# Patient Record
Sex: Female | Born: 1985 | Race: White | Hispanic: No | Marital: Single | Smoking: Current every day smoker
Health system: Southern US, Community
[De-identification: ages and names within clinical notes are randomized; demographics above are authoritative.]

## PROBLEM LIST (undated history)

## (undated) DIAGNOSIS — G43909 Migraine, unspecified, not intractable, without status migrainosus: Secondary | ICD-10-CM

---

## 2018-07-01 DIAGNOSIS — H53149 Visual discomfort, unspecified: Secondary | ICD-10-CM | POA: Insufficient documentation

## 2018-07-01 DIAGNOSIS — G43809 Other migraine, not intractable, without status migrainosus: Secondary | ICD-10-CM | POA: Insufficient documentation

## 2018-07-02 ENCOUNTER — Emergency Department
Admission: EM | Admit: 2018-07-02 | Discharge: 2018-07-02 | Disposition: A | Discharge status: Home / Self Care | Payer: Self-pay | Attending: Emergency Medicine | Admitting: Emergency Medicine

## 2018-07-02 ENCOUNTER — Other Ambulatory Visit: Payer: Self-pay

## 2018-07-02 ENCOUNTER — Encounter: Payer: Self-pay | Admitting: Emergency Medicine

## 2018-07-02 DIAGNOSIS — G43809 Other migraine, not intractable, without status migrainosus: Secondary | ICD-10-CM

## 2018-07-02 HISTORY — DX: Migraine, unspecified, not intractable, without status migrainosus: G43.909

## 2018-07-02 MED ORDER — KETOROLAC TROMETHAMINE 30 MG/ML IJ SOLN
30.0000 mg | Freq: Once | INTRAMUSCULAR | Status: AC
  Administered 2018-07-02: 30 mg via INTRAVENOUS
  Filled 2018-07-02: qty 1

## 2018-07-02 MED ORDER — METOCLOPRAMIDE HCL 5 MG/ML IJ SOLN
10.0000 mg | Freq: Once | INTRAMUSCULAR | Status: AC
  Administered 2018-07-02: 10 mg via INTRAVENOUS
  Filled 2018-07-02: qty 2

## 2018-07-02 MED ORDER — BUTALBITAL-APAP-CAFFEINE 50-325-40 MG PO TABS: 1.0000 | ORAL_TABLET | Freq: Three times a day (TID) | ORAL | 0 refills | Status: DC | PRN

## 2018-07-02 NOTE — ED Notes (Signed)
Pt sleeping quietly. MD aware.

## 2018-07-02 NOTE — ED Triage Notes (Signed)
Patient ambulatory to triage with steady gait, without difficulty or distress noted; pt reports migraines to temples several days; st hx of same

## 2018-07-02 NOTE — ED Notes (Signed)
Reviewed discharge paperwork with patient. Patient states pain is a 4/10 and is easing off. Patient states understanding of discharge paperwork and prescription. Patient signed discharge.

## 2018-07-02 NOTE — ED Provider Notes (Signed)
[facility] Emergency Department Provider Note ________________________   First MD Initiated Contact with Patient 07/02/18 [phone]     (approximate)  I have reviewed the triage vital signs and the nursing notes.   HISTORY  Chief Complaint Headache    HPI Allison Larson is a 33 y.o. female with history of migraine headaches presents to the emergency department with "migraine headache x4 days.  Patient denies taking any medications at home for headache.  Patient does admit to photophobia.  Patient denies any weakness numbness gait instability.  Patient also admits to need to change her prescription for her glasses  Past Medical History:  Diagnosis Date  . Migraine     There are no active problems to display for this patient.   History reviewed. No pertinent surgical history.  Prior to Admission medications   Not on File    Allergies No known drug allergies No family history on file.  Social History Social History   Tobacco Use  . Smoking status: Not on file  Substance Use Topics  . Alcohol use: Not on file  . Drug use: Not on file    Review of Systems Constitutional: No fever/chills Eyes: No visual changes. ENT: No sore throat. Cardiovascular: Denies chest pain. Respiratory: Denies shortness of breath. Gastrointestinal: No abdominal pain.  No nausea, no vomiting.  No diarrhea.  No constipation. Genitourinary: Negative for dysuria. Musculoskeletal: Negative for neck pain.  Negative for back pain. Integumentary: Negative for rash. Neurological: Positive for headaches, positive for focal weakness or numbness.   ____________________________________________   PHYSICAL EXAM:  VITAL SIGNS: ED Triage Vitals  Enc Vitals Group     BP 07/02/18 0011 124/75     Pulse Rate 07/02/18 0011 74     Resp 07/02/18 0011 18     Temp 07/02/18 0011 98.6 F (37 C)     Temp Source 07/02/18 0011 Oral     SpO2 07/02/18 0011 98 %     Weight 07/02/18  0010 88.5 kg (195 lb)     Height 07/02/18 0010 1.6 m (5\' 3" )     Head Circumference --      Peak Flow --      Pain Score 07/02/18 0010 9     Pain Loc --      Pain Edu? --      Excl. in GC? --     Constitutional: Alert and oriented. Well appearing and in no acute distress. Eyes: Conjunctivae are normal.  Mouth/Throat: Mucous membranes are moist. Oropharynx non-erythematous. Neck: No stridor.  Cardiovascular: Normal rate, regular rhythm. Good peripheral circulation. Grossly normal heart sounds. Respiratory: Normal respiratory effort.  No retractions. Lungs CTAB. Gastrointestinal: Soft and nontender. No distention.  Musculoskeletal: No lower extremity tenderness nor edema. No gross deformities of extremities. Neurologic:  Normal speech and language. No gross focal neurologic deficits are appreciated.  Skin:  Skin is warm, dry and intact. No rash noted. Psychiatric: Mood and affect are normal. Speech and behavior are normal.  ____________________________________________    Procedures   ____________________________________________   INITIAL IMPRESSION / ASSESSMENT AND PLAN / ED COURSE  As part of my medical decision making, I reviewed the following data within the electronic MEDICAL RECORD NUMBER   33 year old female present with above-stated history and physical exam consistent with migraine headache which patient states is consistent with previous migraines.  Patient given Toradol and Reglan in the emergency department with resolution of headache.  Patient will be prescribed Fioricet for home ____________________________________________  FINAL CLINICAL IMPRESSION(S) / ED DIAGNOSES  Final diagnoses:  Other migraine without status migrainosus, not intractable     MEDICATIONS GIVEN DURING THIS VISIT:  Medications  ketorolac (TORADOL) 30 MG/ML injection 30 mg (30 mg Intravenous Given 07/02/18 0531)  metoCLOPramide (REGLAN) injection 10 mg (10 mg Intravenous Given 07/02/18 0531)      ED Discharge Orders    None       Note:  This document was prepared using Dragon voice recognition software and may include unintentional dictation errors.    Darci Current, MD 07/02/18 [phone]

## 2018-07-02 NOTE — ED Notes (Signed)
EDP Dr. Manson PasseyBrown in with the patient

## 2018-07-12 ENCOUNTER — Other Ambulatory Visit: Payer: Self-pay

## 2018-07-12 ENCOUNTER — Emergency Department
Admission: EM | Admit: 2018-07-12 | Discharge: 2018-07-12 | Disposition: A | Discharge status: Home / Self Care | Payer: Self-pay | Attending: Emergency Medicine | Admitting: Emergency Medicine

## 2018-07-12 DIAGNOSIS — N390 Urinary tract infection, site not specified: Secondary | ICD-10-CM | POA: Insufficient documentation

## 2018-07-12 DIAGNOSIS — F17210 Nicotine dependence, cigarettes, uncomplicated: Secondary | ICD-10-CM | POA: Insufficient documentation

## 2018-07-12 DIAGNOSIS — N3001 Acute cystitis with hematuria: Secondary | ICD-10-CM

## 2018-07-12 LAB — URINALYSIS, COMPLETE (UACMP) WITH MICROSCOPIC
Bacteria, UA: NONE SEEN
Bilirubin Urine: NEGATIVE
Glucose, UA: NEGATIVE mg/dL
Ketones, ur: NEGATIVE mg/dL
Nitrite: NEGATIVE
Protein, ur: NEGATIVE mg/dL
Specific Gravity, Urine: 1.025 (ref 1.005–1.030)
pH: 6 (ref 5.0–8.0)

## 2018-07-12 LAB — POCT PREGNANCY, URINE: Preg Test, Ur: NEGATIVE

## 2018-07-12 MED ORDER — PHENAZOPYRIDINE HCL 200 MG PO TABS: 200.0000 mg | ORAL_TABLET | Freq: Three times a day (TID) | ORAL | 0 refills | Status: DC | PRN

## 2018-07-12 MED ORDER — CEPHALEXIN 500 MG PO CAPS: 500.0000 mg | ORAL_CAPSULE | Freq: Two times a day (BID) | ORAL | 0 refills | Status: AC

## 2018-07-12 NOTE — ED Provider Notes (Signed)
[facility] Emergency Department Provider Note  ____________________________________________  Time seen: Approximately 2:58 PM  I have reviewed the triage vital signs and the nursing notes.   HISTORY  Chief Complaint Urinary Tract Infection    HPI Allison Larson is a 33 y.o. female who presents to the emergency department for evaluation and treatment of UTI symptoms. She states that she has frequent UTIs with very similar symptoms.  She complains of low back pain and urinary frequency.  Symptoms started last night.  No relief with over-the-counter Azo.  Past Medical History:  Diagnosis Date  . Migraine     There are no active problems to display for this patient.   History reviewed. No pertinent surgical history.  Prior to Admission medications   Medication Sig Start Date End Date Taking? Authorizing Provider  butalbital-acetaminophen-caffeine (FIORICET, ESGIC) 50-325-40 MG tablet Take 1 tablet by mouth every 8 (eight) hours as needed for up to 20 doses for headache. 07/02/18   Darci CurrentBrown, [place] N, MD  cephALEXin (KEFLEX) 500 MG capsule Take 1 capsule (500 mg total) by mouth 2 (two) times daily for 7 days. 07/12/18 07/19/18  Darrel Gloss, Kasandra Knudsenari B, FNP  phenazopyridine (PYRIDIUM) 200 MG tablet Take 1 tablet (200 mg total) by mouth 3 (three) times daily as needed for pain. 07/12/18   Chinita Pesterriplett, Liahm Grivas B, FNP    Allergies Patient has no known allergies.  History reviewed. No pertinent family history.  Social History Social History   Tobacco Use  . Smoking status: Current Every Day Smoker  Substance Use Topics  . Alcohol use: Not Currently  . Drug use: Not on file    Review of Systems Constitutional: Negative for fever. Respiratory: Negative for shortness of breath or cough. Gastrointestinal: Positive for abdominal pain; negative for nausea , negative for vomiting. Genitourinary: Negative for dysuria , negative for vaginal discharge.  Positive for urinary  frequency Musculoskeletal: Positive for back pain. Skin: Negative for acute skin changes/rash/lesion. ____________________________________________   PHYSICAL EXAM:  VITAL SIGNS: ED Triage Vitals  Enc Vitals Group     BP 07/12/18 1400 133/63     Pulse Rate 07/12/18 1400 73     Resp 07/12/18 1400 16     Temp 07/12/18 1400 98.1 F (36.7 C)     Temp Source 07/12/18 1400 Oral     SpO2 07/12/18 1400 97 %     Weight 07/12/18 1401 200 lb (90.7 kg)     Height 07/12/18 1401 5\' 3"  (1.6 m)     Head Circumference --      Peak Flow --      Pain Score 07/12/18 1401 8     Pain Loc --      Pain Edu? --      Excl. in GC? --     Constitutional: Alert and oriented. Well appearing and in no acute distress. Eyes: Conjunctivae are normal. Head: Atraumatic. Nose: No congestion/rhinnorhea. Mouth/Throat: Mucous membranes are moist. Respiratory: Normal respiratory effort.  No retractions. Gastrointestinal: Bowel sounds active x 4; Abdomen is soft without rebound or guarding. Genitourinary: Pelvic exam: Not indicated Musculoskeletal: No extremity tenderness nor edema.  Neurologic:  Normal speech and language. No gross focal neurologic deficits are appreciated. Speech is normal. No gait instability. Skin:  Skin is warm, dry and intact. No rash noted on exposed skin. Psychiatric: Mood and affect are normal. Speech and behavior are normal.  ____________________________________________   LABS (all labs ordered are listed, but only abnormal results are displayed)  Labs Reviewed  URINALYSIS, COMPLETE (UACMP) WITH MICROSCOPIC - Abnormal; Notable for the following components:      Result Value   Color, Urine YELLOW (*)    APPearance HAZY (*)    Hgb urine dipstick LARGE (*)    Leukocytes, UA TRACE (*)    All other components within normal limits  POCT PREGNANCY, URINE  POC URINE PREG, ED   ____________________________________________  RADIOLOGY  Not  indicated ____________________________________________  Procedures  ____________________________________________  33 year old female presenting to the emergency department with her typical urinary tract infection symptoms.  She states that her UTIs typically start out with frequency and low back pain and then rapidly progressed to dysuria.  She states that she has been hospitalized in the past for kidney infections.  She does not feel that she has a kidney infection today, but wanted to get the urinary tract infection diagnosed and treated as soon as possible.  She was last treated in [place].  She recently moved to the area.  He is unsure if any cultures have been performed previously but denies knowledge of any resistant types of bacteria in the past.  Today, she will receive a prescription for Keflex and Pyridium.  She was advised to establish a primary care provider soon as possible, otherwise she will return to the emergency department for symptoms of change or worsen.  INITIAL IMPRESSION / ASSESSMENT AND PLAN / ED COURSE  Pertinent labs & imaging results that were available during my care of the patient were reviewed by me and considered in my medical decision making (see chart for details).  ____________________________________________   FINAL CLINICAL IMPRESSION(S) / ED DIAGNOSES  Final diagnoses:  Acute cystitis with hematuria    Note:  This document was prepared using Dragon voice recognition software and may include unintentional dictation errors.    Chinita Pester, FNP 07/12/18 1516    Dionne Bucy, MD 07/12/18 2238

## 2018-07-12 NOTE — ED Triage Notes (Signed)
States she thinks she has UTI. States low back pain and urinary frequency. States she gets frequent UTI's. Noticed last night. Denies foul odor or burning. A&O, ambulatory. No distress noted.

## 2018-07-12 NOTE — Discharge Instructions (Signed)
Please return to the emergency department if not improving over the next 2 to 3 days or sooner if your symptoms worsen.  You may also take Tylenol or ibuprofen if needed for pain.

## 2018-08-14 ENCOUNTER — Emergency Department
Admission: EM | Admit: 2018-08-14 | Discharge: 2018-08-14 | Disposition: A | Discharge status: Home / Self Care | Payer: Self-pay | Attending: Emergency Medicine | Admitting: Emergency Medicine

## 2018-08-14 ENCOUNTER — Other Ambulatory Visit: Payer: Self-pay

## 2018-08-14 DIAGNOSIS — F17200 Nicotine dependence, unspecified, uncomplicated: Secondary | ICD-10-CM | POA: Insufficient documentation

## 2018-08-14 DIAGNOSIS — N390 Urinary tract infection, site not specified: Secondary | ICD-10-CM | POA: Insufficient documentation

## 2018-08-14 LAB — URINALYSIS, COMPLETE (UACMP) WITH MICROSCOPIC
Bilirubin Urine: NEGATIVE
Glucose, UA: NEGATIVE mg/dL
Hgb urine dipstick: NEGATIVE
Ketones, ur: NEGATIVE mg/dL
Nitrite: NEGATIVE
Protein, ur: NEGATIVE mg/dL
Specific Gravity, Urine: 1.019 (ref 1.005–1.030)
pH: 6 (ref 5.0–8.0)

## 2018-08-14 LAB — POCT PREGNANCY, URINE: Preg Test, Ur: NEGATIVE

## 2018-08-14 MED ORDER — NITROFURANTOIN MONOHYD MACRO 100 MG PO CAPS: 100.0000 mg | ORAL_CAPSULE | Freq: Two times a day (BID) | ORAL | 0 refills | Status: AC

## 2018-08-14 NOTE — ED Provider Notes (Signed)
[facility] Emergency Department Provider Note   ____________________________________________    I have reviewed the triage vital signs and the nursing notes.   HISTORY  Chief Complaint Back Pain and Hematuria     HPI Allison Larson is a 33 y.o. female who presents with complaints of bilateral low back pain as well as urinary frequency.  She reports that she was treated for UTI 2 weeks ago and seemed to briefly improve but then has come back again.  She denies fevers or chills.  No nausea or vomiting.  She reports she completed her course of Keflex.  Past Medical History:  Diagnosis Date  . Migraine     There are no active problems to display for this patient.   No past surgical history on file.  Prior to Admission medications   Medication Sig Start Date End Date Taking? Authorizing Provider  butalbital-acetaminophen-caffeine (FIORICET, ESGIC) 50-325-40 MG tablet Take 1 tablet by mouth every 8 (eight) hours as needed for up to 20 doses for headache. 07/02/18   Darci Current, MD  nitrofurantoin, macrocrystal-monohydrate, (MACROBID) 100 MG capsule Take 1 capsule (100 mg total) by mouth 2 (two) times daily for 7 days. 08/14/18 08/21/18  Jene Every, MD  phenazopyridine (PYRIDIUM) 200 MG tablet Take 1 tablet (200 mg total) by mouth 3 (three) times daily as needed for pain. 07/12/18   Chinita Pester, FNP     Allergies Shellfish allergy  No family history on file.  Social History Social History   Tobacco Use  . Smoking status: Current Every Day Smoker  Substance Use Topics  . Alcohol use: Not Currently  . Drug use: Not on file    Review of Systems  Constitutional: No fever/chills Eyes: No visual changes.  ENT: No sore throat. Cardiovascular: Denies chest pain. Respiratory: Denies shortness of breath. Gastrointestinal: No abdominal pain.  No nausea, no vomiting.  No flank pain Genitourinary: No dysuria, positive  frequency Musculoskeletal: Low back pain as above Skin: Negative for rash. Neurological: Negative for headaches    ____________________________________________   PHYSICAL EXAM:  VITAL SIGNS: ED Triage Vitals  Enc Vitals Group     BP 08/14/18 1904 117/70     Pulse Rate 08/14/18 1904 86     Resp 08/14/18 1904 16     Temp 08/14/18 1904 98.4 F (36.9 C)     Temp Source 08/14/18 1904 Oral     SpO2 08/14/18 1904 98 %     Weight 08/14/18 1905 88.5 kg (195 lb)     Height 08/14/18 1905 1.6 m (5\' 3" )     Head Circumference --      Peak Flow --      Pain Score 08/14/18 1905 10     Pain Loc --      Pain Edu? --      Excl. in GC? --     Constitutional: Alert and oriented.  Eyes: Conjunctivae are normal.   Nose: No congestion/rhinnorhea. Mouth/Throat: Mucous membranes are moist.    Cardiovascular: Normal rate, regular rhythm. Grossly normal heart sounds.  Good peripheral circulation. Respiratory: Normal respiratory effort.  No retractions. Lungs CTAB. Gastrointestinal: Soft and nontender. No distention.  No CVA tenderness.  Reassuring exam Genitourinary: deferred Musculoskeletal: .  Warm and well perfused Neurologic:  Normal speech and language. No gross focal neurologic deficits are appreciated.  Skin:  Skin is warm, dry and intact. No rash noted. Psychiatric: Mood and affect are normal. Speech and behavior are normal.  ____________________________________________   LABS (all labs ordered are listed, but only abnormal results are displayed)  Labs Reviewed  URINALYSIS, COMPLETE (UACMP) WITH MICROSCOPIC - Abnormal; Notable for the following components:      Result Value   Color, Urine YELLOW (*)    APPearance CLOUDY (*)    Leukocytes,Ua LARGE (*)    Bacteria, UA RARE (*)    All other components within normal limits  URINE CULTURE  POC URINE PREG, ED  POCT PREGNANCY, URINE    ____________________________________________  EKG None ____________________________________________  RADIOLOGY  None ____________________________________________   PROCEDURES  Procedure(s) performed: No  Procedures   Critical Care performed: No ____________________________________________   INITIAL IMPRESSION / ASSESSMENT AND PLAN / ED COURSE  Pertinent labs & imaging results that were available during my care of the patient were reviewed by me and considered in my medical decision making (see chart for details).  Patient presents with mild bilateral low back pain, no CVA tenderness, urinalysis consistent with likely urinary tract infection although positive squamous cells, will send urinary culture, cover with Macrobid, outpatient follow-up   ____________________________________________   FINAL CLINICAL IMPRESSION(S) / ED DIAGNOSES  Final diagnoses:  Lower urinary tract infectious disease        Note:  This document was prepared using Dragon voice recognition software and may include unintentional dictation errors.   Jene Every, MD 08/14/18 2217

## 2018-08-14 NOTE — ED Triage Notes (Signed)
Pt states she was treated for a UTI 2 weeks ago. Pt states she has back pain, hematuria that began today. Pt states does have nausea and pressure with urination. Pt appears in no acute distress.

## 2018-08-17 LAB — URINE CULTURE: Culture: >=100000 — AB

## 2018-08-18 NOTE — Progress Notes (Signed)
ED Antimicrobial Stewardship Positive Culture Follow Up   Allison Larson is an 33 y.o. female who presented to [facility] on 08/14/2018 with a chief complaint of  Chief Complaint  Patient presents with  . Back Pain  . Hematuria    Recent Results (from the past 720 hour(s))  Urine Culture     Status: Abnormal   Collection Time: 08/14/18  7:07 PM  Result Value Ref Range Status   Specimen Description   Final    URINE, RANDOM Performed at [facility], [address] [place], [place] [address]    Special Requests   Final    NONE Performed at [facility], [address] [place], [place] [address]    Culture >=100,000 COLONIES/mL KLEBSIELLA PNEUMONIAE (A)  Final   Report Status 08/17/2018 FINAL  Final   Organism ID, Bacteria KLEBSIELLA PNEUMONIAE (A)  Final      Susceptibility   Klebsiella pneumoniae - MIC*    AMPICILLIN >=32 RESISTANT Resistant     CEFAZOLIN <=4 SENSITIVE Sensitive     CEFTRIAXONE <=1 SENSITIVE Sensitive     CIPROFLOXACIN <=0.25 SENSITIVE Sensitive     GENTAMICIN <=1 SENSITIVE Sensitive     IMIPENEM <=0.25 SENSITIVE Sensitive     NITROFURANTOIN 64 INTERMEDIATE Intermediate     TRIMETH/SULFA <=20 SENSITIVE Sensitive     AMPICILLIN/SULBACTAM 4 SENSITIVE Sensitive     PIP/TAZO 8 SENSITIVE Sensitive     Extended ESBL NEGATIVE Sensitive     * >=100,000 COLONIES/mL KLEBSIELLA PNEUMONIAE    [x]  Treated with Nitrofurantoin, organism intermediate resistant to prescribed antimicrobial  New antibiotic prescription: Bactrim DS bid for 3 days  ED Provider: Willy Eddy, MD (NPI [id number])  Called in rx to [facility] on [address] in [place], [place] - left rx with [place], RPh  Albina Billet, PharmD, BCPS Clinical Pharmacist 08/18/2018 2:37 PM

## 2019-01-03 ENCOUNTER — Other Ambulatory Visit: Payer: Self-pay

## 2019-01-03 ENCOUNTER — Emergency Department: Payer: Self-pay

## 2019-01-03 ENCOUNTER — Emergency Department
Admission: EM | Admit: 2019-01-03 | Discharge: 2019-01-03 | Disposition: A | Discharge status: Home / Self Care | Payer: Self-pay | Attending: Emergency Medicine | Admitting: Emergency Medicine

## 2019-01-03 ENCOUNTER — Encounter: Payer: Self-pay | Admitting: Emergency Medicine

## 2019-01-03 DIAGNOSIS — Z91013 Allergy to seafood: Secondary | ICD-10-CM | POA: Insufficient documentation

## 2019-01-03 DIAGNOSIS — F17210 Nicotine dependence, cigarettes, uncomplicated: Secondary | ICD-10-CM | POA: Insufficient documentation

## 2019-01-03 DIAGNOSIS — R1084 Generalized abdominal pain: Secondary | ICD-10-CM | POA: Insufficient documentation

## 2019-01-03 DIAGNOSIS — N760 Acute vaginitis: Secondary | ICD-10-CM | POA: Insufficient documentation

## 2019-01-03 DIAGNOSIS — B9689 Other specified bacterial agents as the cause of diseases classified elsewhere: Secondary | ICD-10-CM

## 2019-01-03 DIAGNOSIS — N939 Abnormal uterine and vaginal bleeding, unspecified: Secondary | ICD-10-CM | POA: Insufficient documentation

## 2019-01-03 DIAGNOSIS — Z79899 Other long term (current) drug therapy: Secondary | ICD-10-CM | POA: Insufficient documentation

## 2019-01-03 LAB — URINALYSIS, COMPLETE (UACMP) WITH MICROSCOPIC
Bacteria, UA: NONE SEEN
Bilirubin Urine: NEGATIVE
Glucose, UA: NEGATIVE mg/dL
Ketones, ur: NEGATIVE mg/dL
Leukocytes,Ua: NEGATIVE
Nitrite: NEGATIVE
Protein, ur: NEGATIVE mg/dL
Specific Gravity, Urine: 1.013 (ref 1.005–1.030)
pH: 5 (ref 5.0–8.0)

## 2019-01-03 LAB — BASIC METABOLIC PANEL
Anion gap: 6 (ref 5–15)
BUN: 7 mg/dL (ref 6–20)
CO2: 27 mmol/L (ref 22–32)
Calcium: 8.4 mg/dL — ABNORMAL LOW (ref 8.9–10.3)
Chloride: 107 mmol/L (ref 98–111)
Creatinine, Ser: 0.76 mg/dL (ref 0.44–1.00)
GFR calc Af Amer: >60 mL/min (ref >60)
GFR calc non Af Amer: >60 mL/min (ref >60)
Glucose, Bld: 113 mg/dL — ABNORMAL HIGH (ref 70–99)
Potassium: 3.5 mmol/L (ref 3.5–5.1)
Sodium: 140 mmol/L (ref 135–145)

## 2019-01-03 LAB — CBC
HCT: 42.2 % (ref 36.0–46.0)
Hemoglobin: 13.2 g/dL (ref 12.0–15.0)
MCH: 26.6 pg (ref 26.0–34.0)
MCHC: 31.3 g/dL (ref 30.0–36.0)
MCV: 85.1 fL (ref 80.0–100.0)
Platelets: 249 10*3/uL (ref 150–400)
RBC: 4.96 MIL/uL (ref 3.87–5.11)
RDW: 13.4 % (ref 11.5–15.5)
WBC: 9.9 10*3/uL (ref 4.0–10.5)
nRBC: 0 % (ref 0.0–0.2)

## 2019-01-03 LAB — CHLAMYDIA/NGC RT PCR (ARMC ONLY)
Chlamydia Tr: NOT DETECTED
N gonorrhoeae: NOT DETECTED

## 2019-01-03 LAB — WET PREP, GENITAL
Sperm: NONE SEEN
Trich, Wet Prep: NONE SEEN
Yeast Wet Prep HPF POC: NONE SEEN

## 2019-01-03 LAB — POCT PREGNANCY, URINE: Preg Test, Ur: NEGATIVE

## 2019-01-03 MED ORDER — METRONIDAZOLE 500 MG PO TABS: 500.0000 mg | ORAL_TABLET | Freq: Two times a day (BID) | ORAL | 0 refills | Status: DC

## 2019-01-03 NOTE — ED Provider Notes (Signed)
[facility] Emergency Department Provider Note  ____________________________________________  Time seen: Approximately 6:43 PM  I have reviewed the triage vital signs and the nursing notes.   HISTORY  Chief Complaint Vaginal Bleeding    HPI Allison Larson is a 33 y.o. female who presents to the emergency department for treatment and evaluation of heavy vaginal bleeding x 16 days.  She also has some very light cramping but states that it is no more than her average menstrual cycle cramping.  She states that the bleeding is light in color.  She is not passing clots.  Menstrual cycle prior to this was normal in amount of bleeding and number of days.  She and her husband have been abstinent since her IUD was removed.  She denies concern for STD exposure prior to abstinence.  Past Medical History:  Diagnosis Date  . Migraine     There are no active problems to display for this patient.   History reviewed. No pertinent surgical history.  Prior to Admission medications   Medication Sig Start Date End Date Taking? Authorizing Provider  butalbital-acetaminophen-caffeine (FIORICET, ESGIC) 50-325-40 MG tablet Take 1 tablet by mouth every 8 (eight) hours as needed for up to 20 doses for headache. 07/02/18   Darci CurrentBrown, Albert Lea N, MD  metroNIDAZOLE (FLAGYL) 500 MG tablet Take 1 tablet (500 mg total) by mouth 2 (two) times daily. 01/03/19   Dalton Mille, Rulon Eisenmengerari B, FNP  phenazopyridine (PYRIDIUM) 200 MG tablet Take 1 tablet (200 mg total) by mouth 3 (three) times daily as needed for pain. 07/12/18   Chinita Pesterriplett, Starla Deller B, FNP    Allergies Shellfish allergy  History reviewed. No pertinent family history.  Social History Social History   Tobacco Use  . Smoking status: Current Every Day Smoker    Packs/day: 1.00    Types: Cigarettes  . Smokeless tobacco: Never Used  Substance Use Topics  . Alcohol use: Not Currently  . Drug use: Not on file    Review of Systems Constitutional:  Negative for fever. Respiratory: Negative for shortness of breath or cough. Gastrointestinal: Negative for abdominal pain; negative for nausea , Negative for vomiting. Genitourinary: negative for dysuria , positive for vaginal discharge/bleeding. Musculoskeletal: Negative for back pain. Skin: Negative for acute skin changes/rash/lesion. ____________________________________________   PHYSICAL EXAM:  VITAL SIGNS: ED Triage Vitals  Enc Vitals Group     BP 01/03/19 1816 129/71     Pulse Rate 01/03/19 1816 80     Resp 01/03/19 1816 18     Temp 01/03/19 1816 99 F (37.2 C)     Temp Source 01/03/19 1816 Oral     SpO2 01/03/19 1816 98 %     Weight 01/03/19 1826 215 lb (97.5 kg)     Height 01/03/19 1826 5\' 3"  (1.6 m)     Head Circumference --      Peak Flow --      Pain Score 01/03/19 1826 5     Pain Loc --      Pain Edu? --      Excl. in GC? --     Constitutional: Alert and oriented. Well appearing and in no acute distress. Eyes: Conjunctivae are normal. Head: Atraumatic. Nose: No congestion/rhinnorhea. Mouth/Throat: Mucous membranes are moist. Respiratory: Normal respiratory effort.  No retractions. Gastrointestinal: Bowel sounds active x 4; Abdomen is soft without rebound or guarding. Genitourinary: Pelvic exam: Dark red blood noted in vaginal vault and cervical os. Adnexal tenderness bilaterally.  Musculoskeletal: No extremity tenderness nor edema.  Neurologic:  Normal speech and language. No gross focal neurologic deficits are appreciated. Speech is normal. No gait instability. Skin:  Skin is warm, dry and intact. No rash noted on exposed skin. Psychiatric: Mood and affect are normal. Speech and behavior are normal.  ____________________________________________   LABS (all labs ordered are listed, but only abnormal results are displayed)  Labs Reviewed  WET PREP, GENITAL - Abnormal; Notable for the following components:      Result Value   Clue Cells Wet Prep HPF POC  PRESENT (*)    WBC, Wet Prep HPF POC FEW (*)    All other components within normal limits  URINALYSIS, COMPLETE (UACMP) WITH MICROSCOPIC - Abnormal; Notable for the following components:   Color, Urine YELLOW (*)    APPearance HAZY (*)    Hgb urine dipstick MODERATE (*)    All other components within normal limits  BASIC METABOLIC PANEL - Abnormal; Notable for the following components:   Glucose, Bld 113 (*)    Calcium 8.4 (*)    All other components within normal limits  CHLAMYDIA/NGC RT PCR ([facility] ONLY)  CBC  POC URINE PREG, ED  POCT PREGNANCY, URINE   ____________________________________________  RADIOLOGY  [place] Pending ____________________________________________  Procedures  ____________________________________________  34 year old female presenting to the emergency department for treatment and evaluation of 16 days of vaginal bleeding.  Patient is concerned that she may have a urinary tract infection as she has had this type of bleeding in the past with urinary tract infection.  Approximately 3 months ago she had her IUD removed due to unwanted side effects.  She is states that she is has been abstinent since it was removed.   Pelvic exam as above with adnexal tenderness. Will request ultrasound.  No findings of concern on ultrasound. Clue cells present in wet prep. She will be discharged home to follow up with gynecology. She is to return to the ER for new concerns if unable to schedule an appointment with gynecology or primary care.  INITIAL IMPRESSION / ASSESSMENT AND PLAN / ED COURSE  Pertinent labs & imaging results that were available during my care of the patient were reviewed by me and considered in my medical decision making (see chart for details).  ____________________________________________   FINAL CLINICAL IMPRESSION(S) / ED DIAGNOSES  Final diagnoses:  Vaginal bleeding  Abnormal uterine bleeding (AUB)  Bacterial vaginitis    Note:  This  document was prepared using Dragon voice recognition software and may include unintentional dictation errors.   Victorino Dike, FNP 01/03/19 2046    Lavonia Drafts, MD 01/03/19 2118

## 2019-01-03 NOTE — ED Triage Notes (Signed)
Pt arrived via POV with reports of vaginal bleeding x 16 days, pt states she has been using super plus tampon, Pt states she had a normal period but continued to have bleeding after.  Pt states she has hx of UTI with bleeding. Denies any dysuria.  Pt does report some low abdominal cramping.

## 2019-01-03 NOTE — Discharge Instructions (Addendum)
Please establish care with gynecology.  Return to the ER for increase in pain or bleeding if unable to schedule an appointment.

## 2019-10-04 ENCOUNTER — Other Ambulatory Visit: Payer: Self-pay

## 2019-10-04 ENCOUNTER — Emergency Department: Payer: Self-pay

## 2019-10-04 DIAGNOSIS — F17210 Nicotine dependence, cigarettes, uncomplicated: Secondary | ICD-10-CM | POA: Insufficient documentation

## 2019-10-04 DIAGNOSIS — J209 Acute bronchitis, unspecified: Secondary | ICD-10-CM | POA: Insufficient documentation

## 2019-10-04 DIAGNOSIS — Z20822 Contact with and (suspected) exposure to covid-19: Secondary | ICD-10-CM | POA: Insufficient documentation

## 2019-10-04 DIAGNOSIS — Z79899 Other long term (current) drug therapy: Secondary | ICD-10-CM | POA: Insufficient documentation

## 2019-10-04 NOTE — ED Triage Notes (Signed)
Pt in with co cough for over a week, no fever. States has been worsening for 4 days. No fever, states nonproductive.

## 2019-10-05 ENCOUNTER — Emergency Department
Admission: EM | Admit: 2019-10-05 | Discharge: 2019-10-05 | Disposition: A | Discharge status: Home / Self Care | Payer: Self-pay | Attending: Emergency Medicine | Admitting: Emergency Medicine

## 2019-10-05 DIAGNOSIS — R059 Cough, unspecified: Secondary | ICD-10-CM

## 2019-10-05 DIAGNOSIS — R05 Cough: Secondary | ICD-10-CM

## 2019-10-05 DIAGNOSIS — J209 Acute bronchitis, unspecified: Secondary | ICD-10-CM

## 2019-10-05 LAB — SARS CORONAVIRUS 2 (TAT 6-24 HRS): SARS Coronavirus 2: NEGATIVE

## 2019-10-05 MED ORDER — PREDNISONE 20 MG PO TABS: ORAL_TABLET | ORAL | 0 refills | Status: DC

## 2019-10-05 MED ORDER — HYDROCOD POLST-CPM POLST ER 10-8 MG/5ML PO SUER
5.0000 mL | Freq: Once | ORAL | Status: AC
  Administered 2019-10-05: 5 mL via ORAL
  Filled 2019-10-05: qty 5

## 2019-10-05 MED ORDER — ALBUTEROL SULFATE HFA 108 (90 BASE) MCG/ACT IN AERS: 2.0000 | INHALATION_SPRAY | RESPIRATORY_TRACT | 0 refills | Status: AC | PRN

## 2019-10-05 MED ORDER — HYDROCOD POLST-CPM POLST ER 10-8 MG/5ML PO SUER: 5.0000 mL | Freq: Two times a day (BID) | ORAL | 0 refills | Status: DC

## 2019-10-05 MED ORDER — PREDNISONE 20 MG PO TABS
60.0000 mg | ORAL_TABLET | Freq: Once | ORAL | Status: AC
  Administered 2019-10-05: 60 mg via ORAL
  Filled 2019-10-05: qty 3

## 2019-10-05 NOTE — ED Provider Notes (Signed)
[facility] Emergency Department Provider Note   ____________________________________________   First MD Initiated Contact with Patient 10/05/19 0403     (approximate)  I have reviewed the triage vital signs and the nursing notes.   HISTORY  Chief Complaint Cough    HPI Allison Larson is a 34 y.o. female who presents to the ED from home with a chief complaint of cough.  Patient reports loose and rattly cough, progressive over the past week; worse over the past 4 days.  Reports hearing wheezing.  History of bronchitis.  Denies associated fever, chest pain, abdominal pain, nausea, vomiting or diarrhea.  Denies known COVID-19 exposure.       Past Medical History:  Diagnosis Date  . Migraine     There are no problems to display for this patient.   No past surgical history on file.  Prior to Admission medications   Medication Sig Start Date End Date Taking? Authorizing Provider  albuterol (VENTOLIN HFA) 108 (90 Base) MCG/ACT inhaler Inhale 2 puffs into the lungs every 4 (four) hours as needed for wheezing or shortness of breath. 10/05/19   Irean Hong, MD  butalbital-acetaminophen-caffeine (FIORICET, ESGIC) [phone] MG tablet Take 1 tablet by mouth every 8 (eight) hours as needed for up to 20 doses for headache. 07/02/18   Darci Current, MD  chlorpheniramine-HYDROcodone [facility] PENNKINETIC ER) 10-8 MG/5ML SUER Take 5 mLs by mouth 2 (two) times daily. 10/05/19   Irean Hong, MD  metroNIDAZOLE (FLAGYL) 500 MG tablet Take 1 tablet (500 mg total) by mouth 2 (two) times daily. 01/03/19   Triplett, Rulon Eisenmenger B, FNP  phenazopyridine (PYRIDIUM) 200 MG tablet Take 1 tablet (200 mg total) by mouth 3 (three) times daily as needed for pain. 07/12/18   Kem Boroughs B, FNP  predniSONE (DELTASONE) 20 MG tablet 3 tablets daily x 4 days 10/05/19   Irean Hong, MD    Allergies Shellfish allergy  No family history on file.  Social History Social History   Tobacco  Use  . Smoking status: Current Every Day Smoker    Packs/day: 1.00    Types: Cigarettes  . Smokeless tobacco: Never Used  Substance Use Topics  . Alcohol use: Not Currently  . Drug use: Not on file    Review of Systems  Constitutional: No fever/chills Eyes: No visual changes. ENT: No sore throat. Cardiovascular: Denies chest pain. Respiratory: Denies shortness of breath. Gastrointestinal: No abdominal pain.  No nausea, no vomiting.  No diarrhea.  No constipation. Genitourinary: Negative for dysuria. Musculoskeletal: Negative for back pain. Skin: Negative for rash. Neurological: Negative for headaches, focal weakness or numbness.   ____________________________________________   PHYSICAL EXAM:  VITAL SIGNS: ED Triage Vitals  Enc Vitals Group     BP 10/04/19 2341 (!) 130/101     Pulse Rate 10/04/19 2341 84     Resp 10/04/19 2341 20     Temp 10/04/19 2341 98.3 F (36.8 C)     Temp Source 10/04/19 2341 Oral     SpO2 10/04/19 2341 98 %     Weight 10/04/19 2342 197 lb (89.4 kg)     Height 10/04/19 2342 5\' 3"  (1.6 m)     Head Circumference --      Peak Flow --      Pain Score 10/04/19 2341 0     Pain Loc --      Pain Edu? --      Excl. in GC? --  Constitutional: Alert and oriented. Well appearing and in no acute distress. Eyes: Conjunctivae are normal. PERRL. EOMI. Head: Atraumatic. Nose: No congestion/rhinnorhea. Mouth/Throat: Mucous membranes are moist.   Neck: No stridor.   Cardiovascular: Normal rate, regular rhythm. Grossly normal heart sounds.  Good peripheral circulation. Respiratory: Normal respiratory effort.  No retractions. Lungs CTAB.  Loose cough noted. Gastrointestinal: Soft and nontender. No distention. No abdominal bruits. No CVA tenderness. Musculoskeletal: No lower extremity tenderness nor edema.  No joint effusions. Neurologic:  Normal speech and language. No gross focal neurologic deficits are appreciated. No gait instability. Skin:  Skin is  warm, dry and intact. No rash noted. Psychiatric: Mood and affect are normal. Speech and behavior are normal.  ____________________________________________   LABS (all labs ordered are listed, but only abnormal results are displayed)  Labs Reviewed  SARS CORONAVIRUS 2 (TAT 6-24 HRS)   ____________________________________________  EKG  None ____________________________________________  RADIOLOGY  ED MD interpretation: No acute cardiopulmonary process  Official radiology report(s): DG Chest 2 View  Result Date: 10/05/2019 CLINICAL DATA:  Cough EXAM: CHEST - 2 VIEW COMPARISON:  None. FINDINGS: The heart size and mediastinal contours are within normal limits. Both lungs are clear. The visualized skeletal structures are unremarkable. IMPRESSION: No active cardiopulmonary disease. Electronically Signed   By: Donavan Foil M.D.   On: 10/05/2019 00:22    ____________________________________________   PROCEDURES  Procedure(s) performed (including Critical Care):  Procedures   ____________________________________________   INITIAL IMPRESSION / ASSESSMENT AND PLAN / ED COURSE  As part of my medical decision making, I reviewed the following data within the [place] History obtained from family, Nursing notes reviewed and incorporated, Radiograph reviewed and Notes from prior ED visits     Allison Larson was evaluated in Emergency Department on 10/05/2019 for the symptoms described in the history of present illness. She was evaluated in the context of the global COVID-19 pandemic, which necessitated consideration that the patient might be at risk for infection with the SARS-CoV-2 virus that causes COVID-19. Institutional protocols and algorithms that pertain to the evaluation of patients at risk for COVID-19 are in a state of rapid change based on information released by regulatory bodies including the CDC and federal and state organizations. These policies and  algorithms were followed during the patient's care in the ED.    34 year old smoker presenting with 1 week history of loose cough with occasional wheezing.  Chest x-ray negative for pneumonia.  Will start prednisone burst, Tussionex as needed for cough, albuterol inhaler as needed for wheezing.  Obtain COVID-19 swab.  Strict return precautions given.  Patient and spouse verbalize understanding and agrees with plan of care.      ____________________________________________   FINAL CLINICAL IMPRESSION(S) / ED DIAGNOSES  Final diagnoses:  Cough  Acute bronchitis, unspecified organism     ED Discharge Orders         Ordered    predniSONE (DELTASONE) 20 MG tablet     10/05/19 0410    chlorpheniramine-HYDROcodone (TUSSIONEX PENNKINETIC ER) 10-8 MG/5ML SUER  2 times daily     10/05/19 0410    albuterol (VENTOLIN HFA) 108 (90 Base) MCG/ACT inhaler  Every 4 hours PRN     10/05/19 0410           Note:  This document was prepared using Dragon voice recognition software and may include unintentional dictation errors.   Paulette Blanch, MD 10/05/19 [phone]

## 2019-10-05 NOTE — Discharge Instructions (Signed)
Finish steroid as prescribed (Prednisone 60mg  daily x 4 days). Start your next dose Wednesday. Take cough medicine as needed (Tussionex). Your Covid swab is pending.  You will be notified of any positive results. Use Albuterol inhaler 2 puffs every 4 hours as needed for wheezing/difficulty breathing. Return to the ER for worsening symptoms, persistent vomiting, difficulty breathing or other concerns.

## 2020-02-02 IMAGING — US US PELVIS COMPLETE WITH TRANSVAGINAL
1 series · 13 of 25 positions shown · non-contrast
Comparison: None

CLINICAL DATA: Dysfunctional uterine bleeding.



[Series 1: us pelvis complete with transvaginal · 13 of 74 slices shown]
[im 1/74]
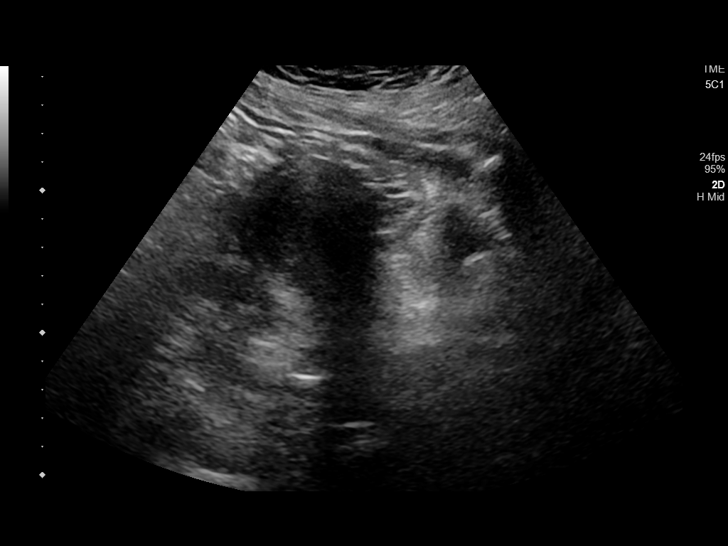
[im 7/74]
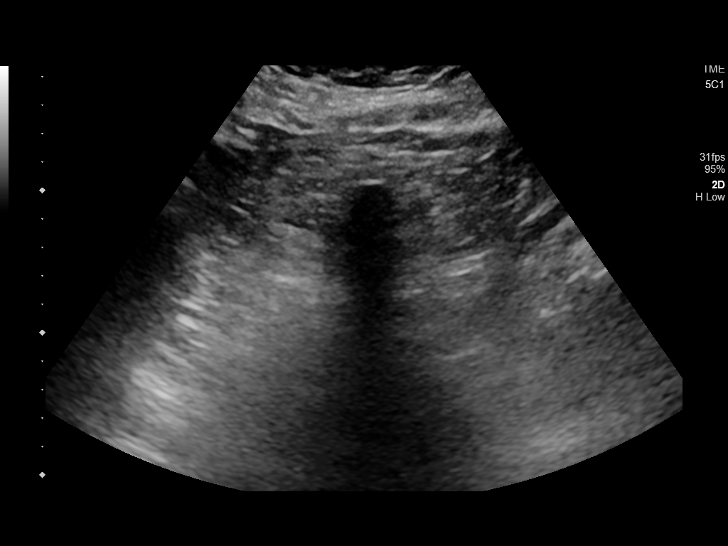
[im 13/74]
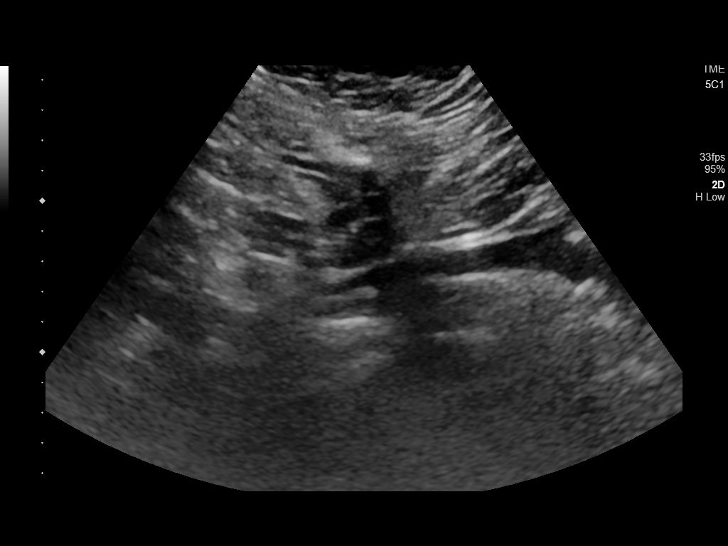
[im 19/74]
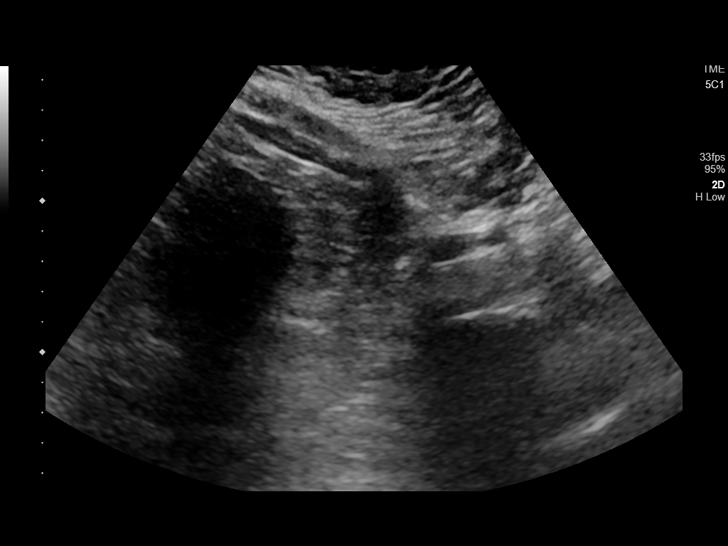
[im 25/74]
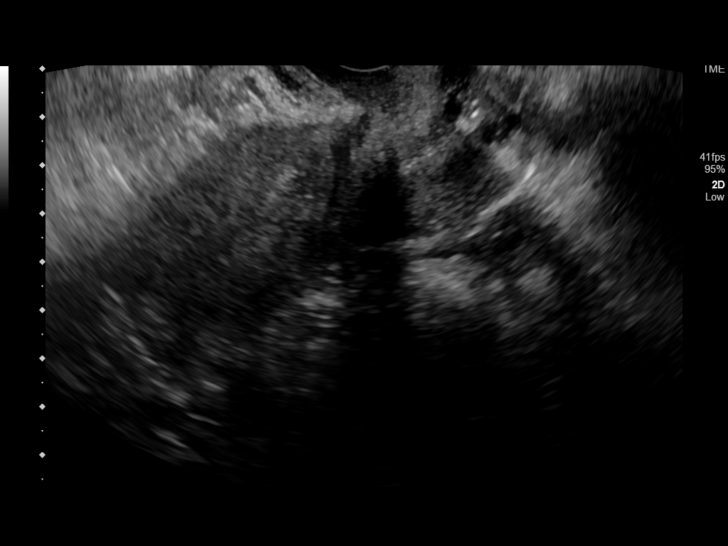
[im 31/74]
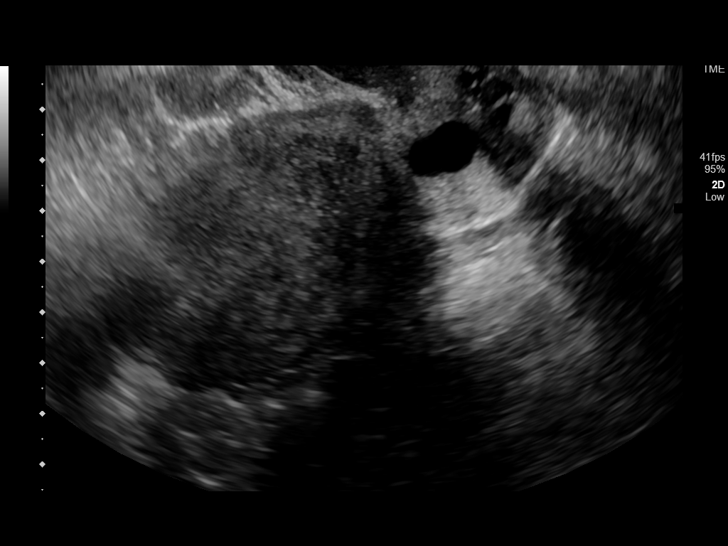
[im 37/74]
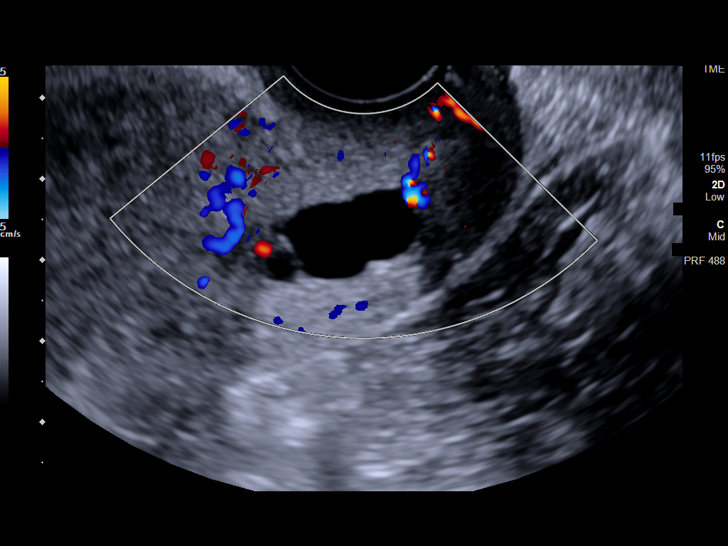
[im 43/74]
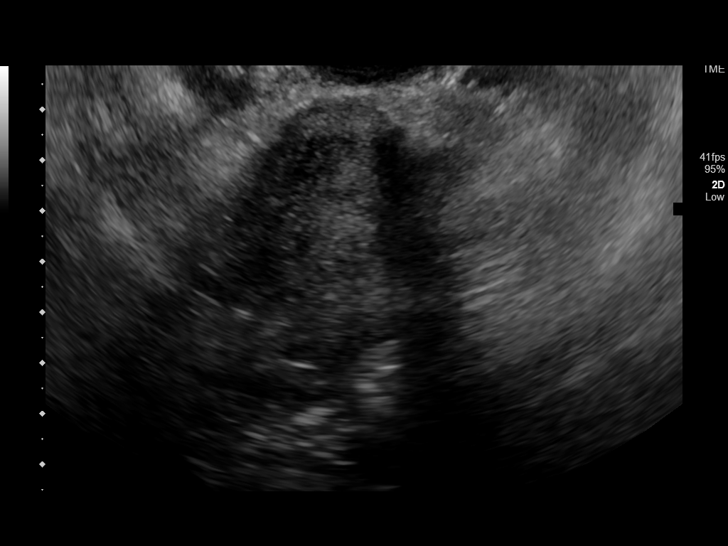
[im 49/74]
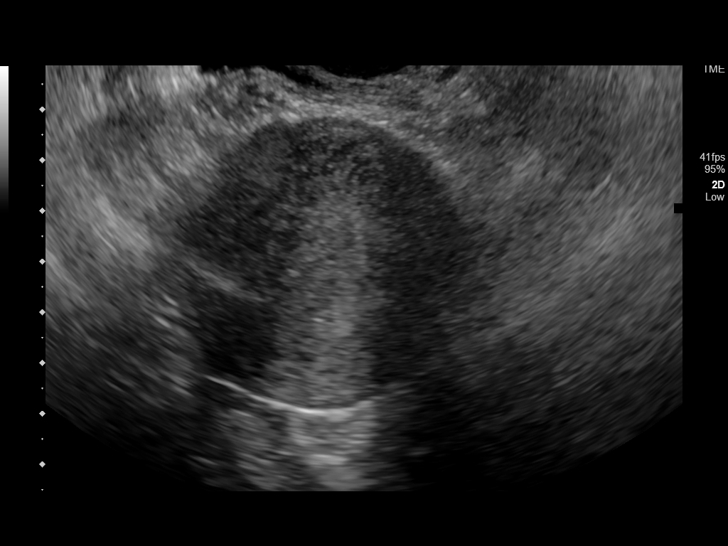
[im 55/74]
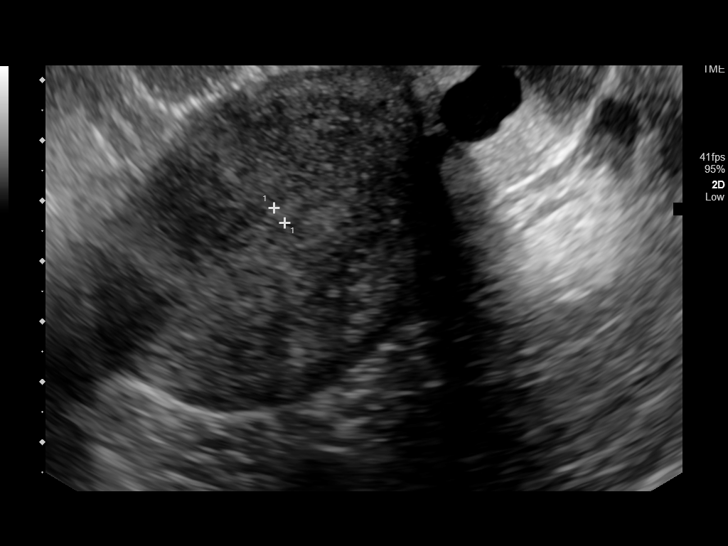
[im 61/74]
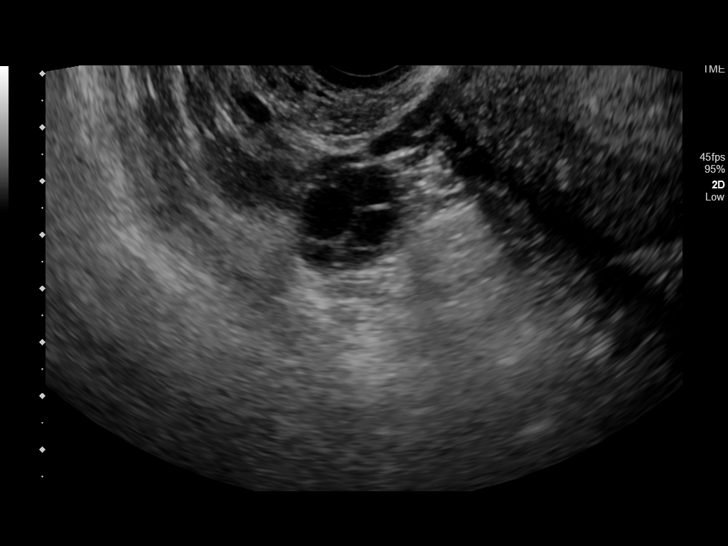
[im 67/74]
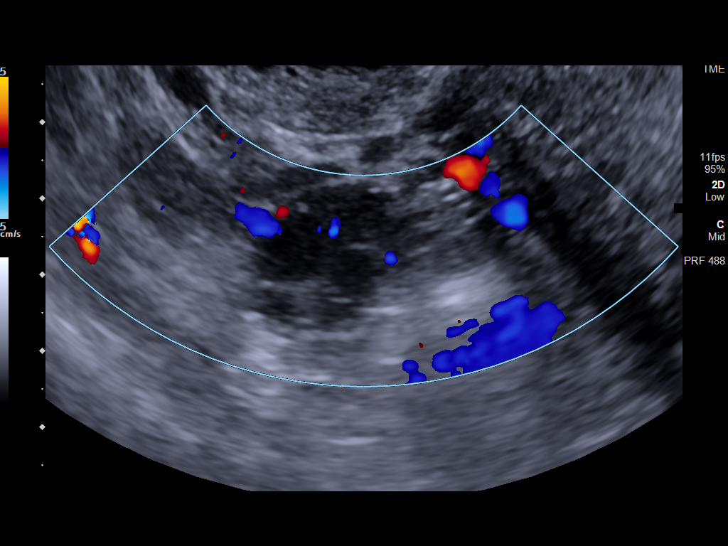
[im 74/74]
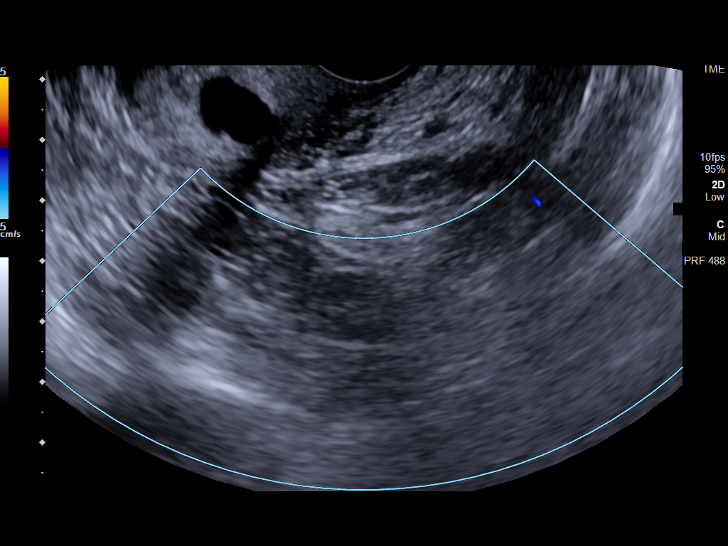

[13 of 25 positions shown; findings below may reference images not displayed]

FINDINGS: Uterus

Measurements: 9.9 x 4.7 x 5.1 cm = volume: 123 mL. No fibroids or
other mass visualized. Large nabothian cyst noted.

Endometrium

The endometrium is indistinct. Accurate endometrial thickness cannot
be confidently determine.

Right ovary

Measurements: 2.4 x 1.8 x 1.9 cm = volume: 4.3 mL. Normal
appearance/no adnexal mass.

Left ovary

Measurements: 2.2 x 1.1 x 1.7 cm = volume: 2.3 mL. Normal
appearance/no adnexal mass.

Other findings

No abnormal free fluid.
IMPRESSION: Indistinct appearance of the endometrium, nonspecific.

Please correlate clinically. If further imaging evaluation is
desired MRI of the pelvis without and with contrast may be
considered.

## 2020-02-24 ENCOUNTER — Encounter: Payer: Self-pay | Admitting: Emergency Medicine

## 2020-02-24 ENCOUNTER — Other Ambulatory Visit: Payer: Self-pay

## 2020-02-24 ENCOUNTER — Emergency Department
Admission: EM | Admit: 2020-02-24 | Discharge: 2020-02-24 | Disposition: A | Discharge status: Home / Self Care | Payer: Self-pay | Attending: Emergency Medicine | Admitting: Emergency Medicine

## 2020-02-24 DIAGNOSIS — J039 Acute tonsillitis, unspecified: Secondary | ICD-10-CM | POA: Insufficient documentation

## 2020-02-24 DIAGNOSIS — F17210 Nicotine dependence, cigarettes, uncomplicated: Secondary | ICD-10-CM | POA: Insufficient documentation

## 2020-02-24 LAB — GROUP A STREP BY PCR: Group A Strep by PCR: NOT DETECTED

## 2020-02-24 MED ORDER — AMOXICILLIN 875 MG PO TABS: 875.0000 mg | ORAL_TABLET | Freq: Two times a day (BID) | ORAL | 0 refills | Status: AC

## 2020-02-24 NOTE — Discharge Instructions (Signed)
Follow-up with your primary care provider if any continued problems or concerns.  Return to the emergency department over the holiday weekend if any severe worsening of your symptoms or urgent concerns.  Begin taking antibiotics until completely finished.  The results of your strep test will be seen in my chart.  You may take Tylenol or ibuprofen as needed for throat pain.  Increase fluids.  Also discard your current toothbrush after taking the antibiotic for 24 to 48 hours so that you do not get reinfected after finishing the antibiotic.

## 2020-02-24 NOTE — ED Triage Notes (Signed)
Pt in via POV, reports sore throat and fever since Monday, taking OTC medications without any relief.  Ambulatory to room, NAD noted at this time.

## 2020-02-24 NOTE — ED Provider Notes (Signed)
[facility] Emergency Department Provider Note  ____________________________________________   First MD Initiated Contact with Patient 02/24/20 1331     (approximate)  I have reviewed the triage vital signs and the nursing notes.   HISTORY  Chief Complaint Sore Throat   HPI Allison Larson is a 34 y.o. female presents to the ED with complaint of sore throat that began 4 days ago.  Patient states that her children at home were treated for strep throat approximately 3 weeks ago.  Patient reports that she has had the Covid vaccine and denies any exposure to Covid.  She reports that she has had multiple episodes of strep throat and this feels the same.  She has had a subjective fever with chills.  She denies any cough or difficulty breathing.  There is been no change in taste or smell, no nausea, vomiting or diarrhea.  She rates her pain as a 10/10.       Past Medical History:  Diagnosis Date  . Migraine     There are no problems to display for this patient.   History reviewed. No pertinent surgical history.  Prior to Admission medications   Medication Sig Start Date End Date Taking? Authorizing Provider  albuterol (VENTOLIN HFA) 108 (90 Base) MCG/ACT inhaler Inhale 2 puffs into the lungs every 4 (four) hours as needed for wheezing or shortness of breath. 10/05/19   Irean Hong, MD  amoxicillin (AMOXIL) 875 MG tablet Take 1 tablet (875 mg total) by mouth 2 (two) times daily. 02/24/20   Tommi Rumps, PA-C    Allergies Shellfish allergy  No family history on file.  Social History Social History   Tobacco Use  . Smoking status: Current Every Day Smoker    Packs/day: 1.00    Types: Cigarettes  . Smokeless tobacco: Never Used  Vaping Use  . Vaping Use: Never used  Substance Use Topics  . Alcohol use: Not Currently  . Drug use: Never    Review of Systems Constitutional: Subjective fever/chills Eyes: No visual changes. ENT: Positive sore  throat. Cardiovascular: Denies chest pain. Respiratory: Denies shortness of breath.  Negative for cough. Gastrointestinal: No abdominal pain.  No nausea, no vomiting.  No diarrhea.   Musculoskeletal: Negative for muscle aches. Skin: Negative for rash. Neurological: Negative for headaches, focal weakness or numbness.  ____________________________________________   PHYSICAL EXAM:  VITAL SIGNS: ED Triage Vitals  Enc Vitals Group     BP 02/24/20 1310 117/69     Pulse Rate 02/24/20 1310 87     Resp 02/24/20 1310 16     Temp 02/24/20 1310 99.6 F (37.6 C)     Temp Source 02/24/20 1310 Oral     SpO2 02/24/20 1310 97 %     Weight 02/24/20 1327 198 lb (89.8 kg)     Height 02/24/20 1327 5\' 3"  (1.6 m)     Head Circumference --      Peak Flow --      Pain Score 02/24/20 1327 10     Pain Loc --      Pain Edu? --      Excl. in GC? --     Constitutional: Alert and oriented. Well appearing and in no acute distress. Eyes: Conjunctivae are normal. PERRL. EOMI. Head: Atraumatic. Nose: No congestion/rhinnorhea. Mouth/Throat: Mucous membranes are moist.  Oropharynx non-erythematous.  Tonsillar enlargement bilaterally.  Uvula is midline.  No exudate was noted.  Positive bilateral cervical adenopathy with right being slightly more  tender than the left. Neck: No stridor.   Cardiovascular: Normal rate, regular rhythm. Grossly normal heart sounds.  Good peripheral circulation. Respiratory: Normal respiratory effort.  No retractions. Lungs CTAB. Musculoskeletal: Moves upper and lower extremities any difficulty normal gait was noted.. Neurologic:  Normal speech and language. No gross focal neurologic deficits are appreciated.  Skin:  Skin is warm, dry and intact. No rash noted. Psychiatric: Mood and affect are normal. Speech and behavior are normal.  ____________________________________________   LABS (all labs ordered are listed, but only abnormal results are displayed)  Labs Reviewed  GROUP  A STREP BY PCR    PROCEDURES  Procedure(s) performed (including Critical Care):  Procedures   ____________________________________________   INITIAL IMPRESSION / ASSESSMENT AND PLAN / ED COURSE  As part of my medical decision making, I reviewed the following data within the electronic MEDICAL RECORD NUMBER Notes from prior ED visits and [place] Controlled Substance Database  Allison Larson was evaluated in Emergency Department on 02/24/2020 for the symptoms described in the history of present illness. She was evaluated in the context of the global COVID-19 pandemic, which necessitated consideration that the patient might be at risk for infection with the SARS-CoV-2 virus that causes COVID-19. Institutional protocols and algorithms that pertain to the evaluation of patients at risk for COVID-19 are in a state of rapid change based on information released by regulatory bodies including the CDC and federal and state organizations. These policies and algorithms were followed during the patient's care in the ED.  34 year old female presents to the ED with complaint of sore throat and fever that began 4 days ago.  Has been vaccinated for Covid and denies any other Covid symptoms.  We discussed the delta variant and that it does give different symptoms however patient states that her children were treated for strep throat approximately 10 days ago and that she has had multiple strep throats in the past and this feels very much like it.  On physical exam tonsils are enlarged but no exudate and uvula was midline.  Patient had a strep test done with that was told that she did not have to wait for the results as we would be treating for tonsillitis.  She is aware that she can see the results of her strep test on my chart.  A prescription for amoxicillin 875 twice daily for 10 days was sent to her pharmacy.  She is encouraged to return to the emergency department if any severe worsening of her  symptoms.  ____________________________________________   FINAL CLINICAL IMPRESSION(S) / ED DIAGNOSES  Final diagnoses:  Acute tonsillitis, unspecified etiology     ED Discharge Orders         Ordered    amoxicillin (AMOXIL) 875 MG tablet  2 times daily        02/24/20 1343           Note:  This document was prepared using Dragon voice recognition software and may include unintentional dictation errors.    Tommi Rumps, PA-C 02/24/20 1451    Minna Antis, MD 02/25/20 2022

## 2020-11-02 IMAGING — CR DG CHEST 2V
2 series · 2 of 2 positions shown · non-contrast
Comparison: None.

CLINICAL DATA: Cough

EXAM:
CHEST - 2 VIEW

[chest pa]
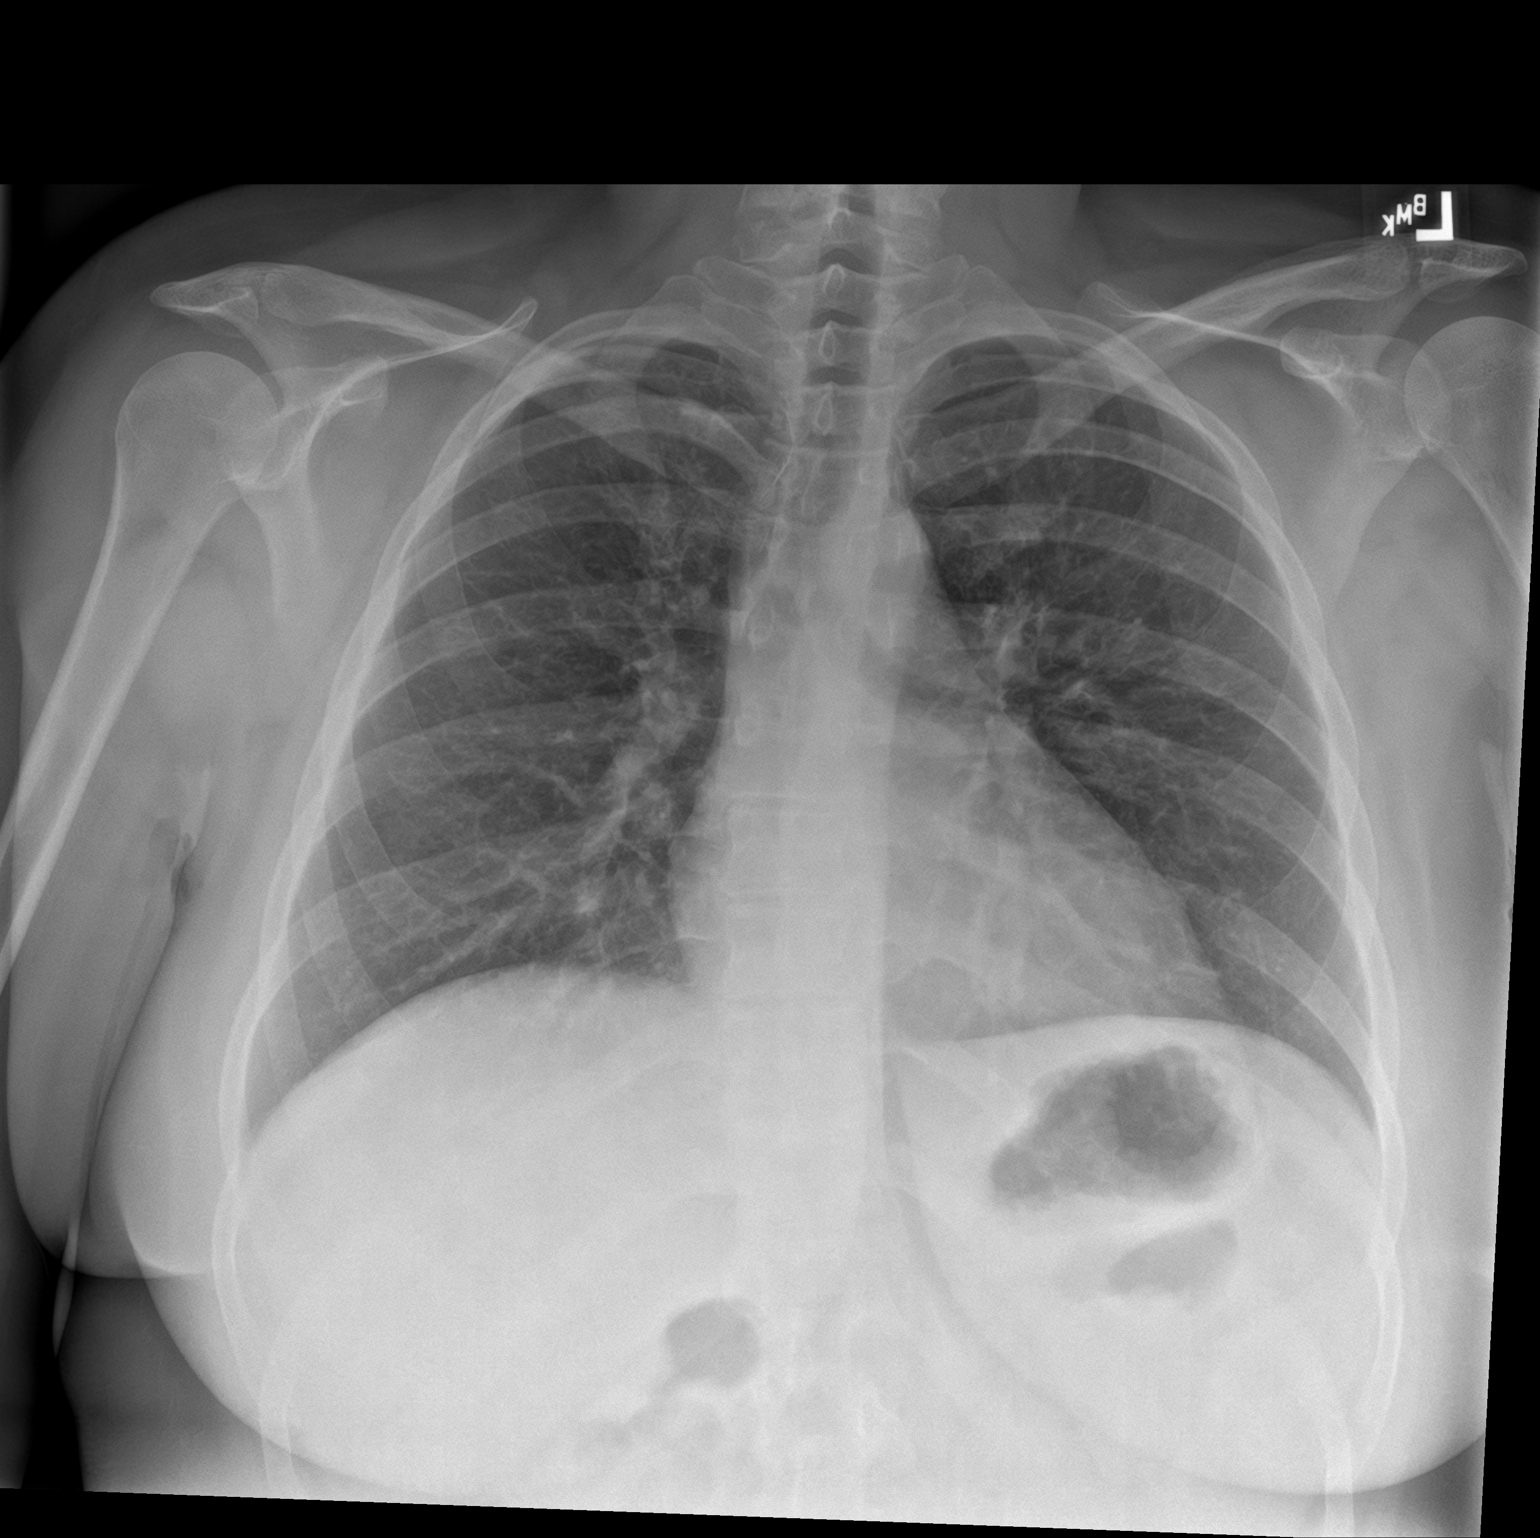

[chest lat]
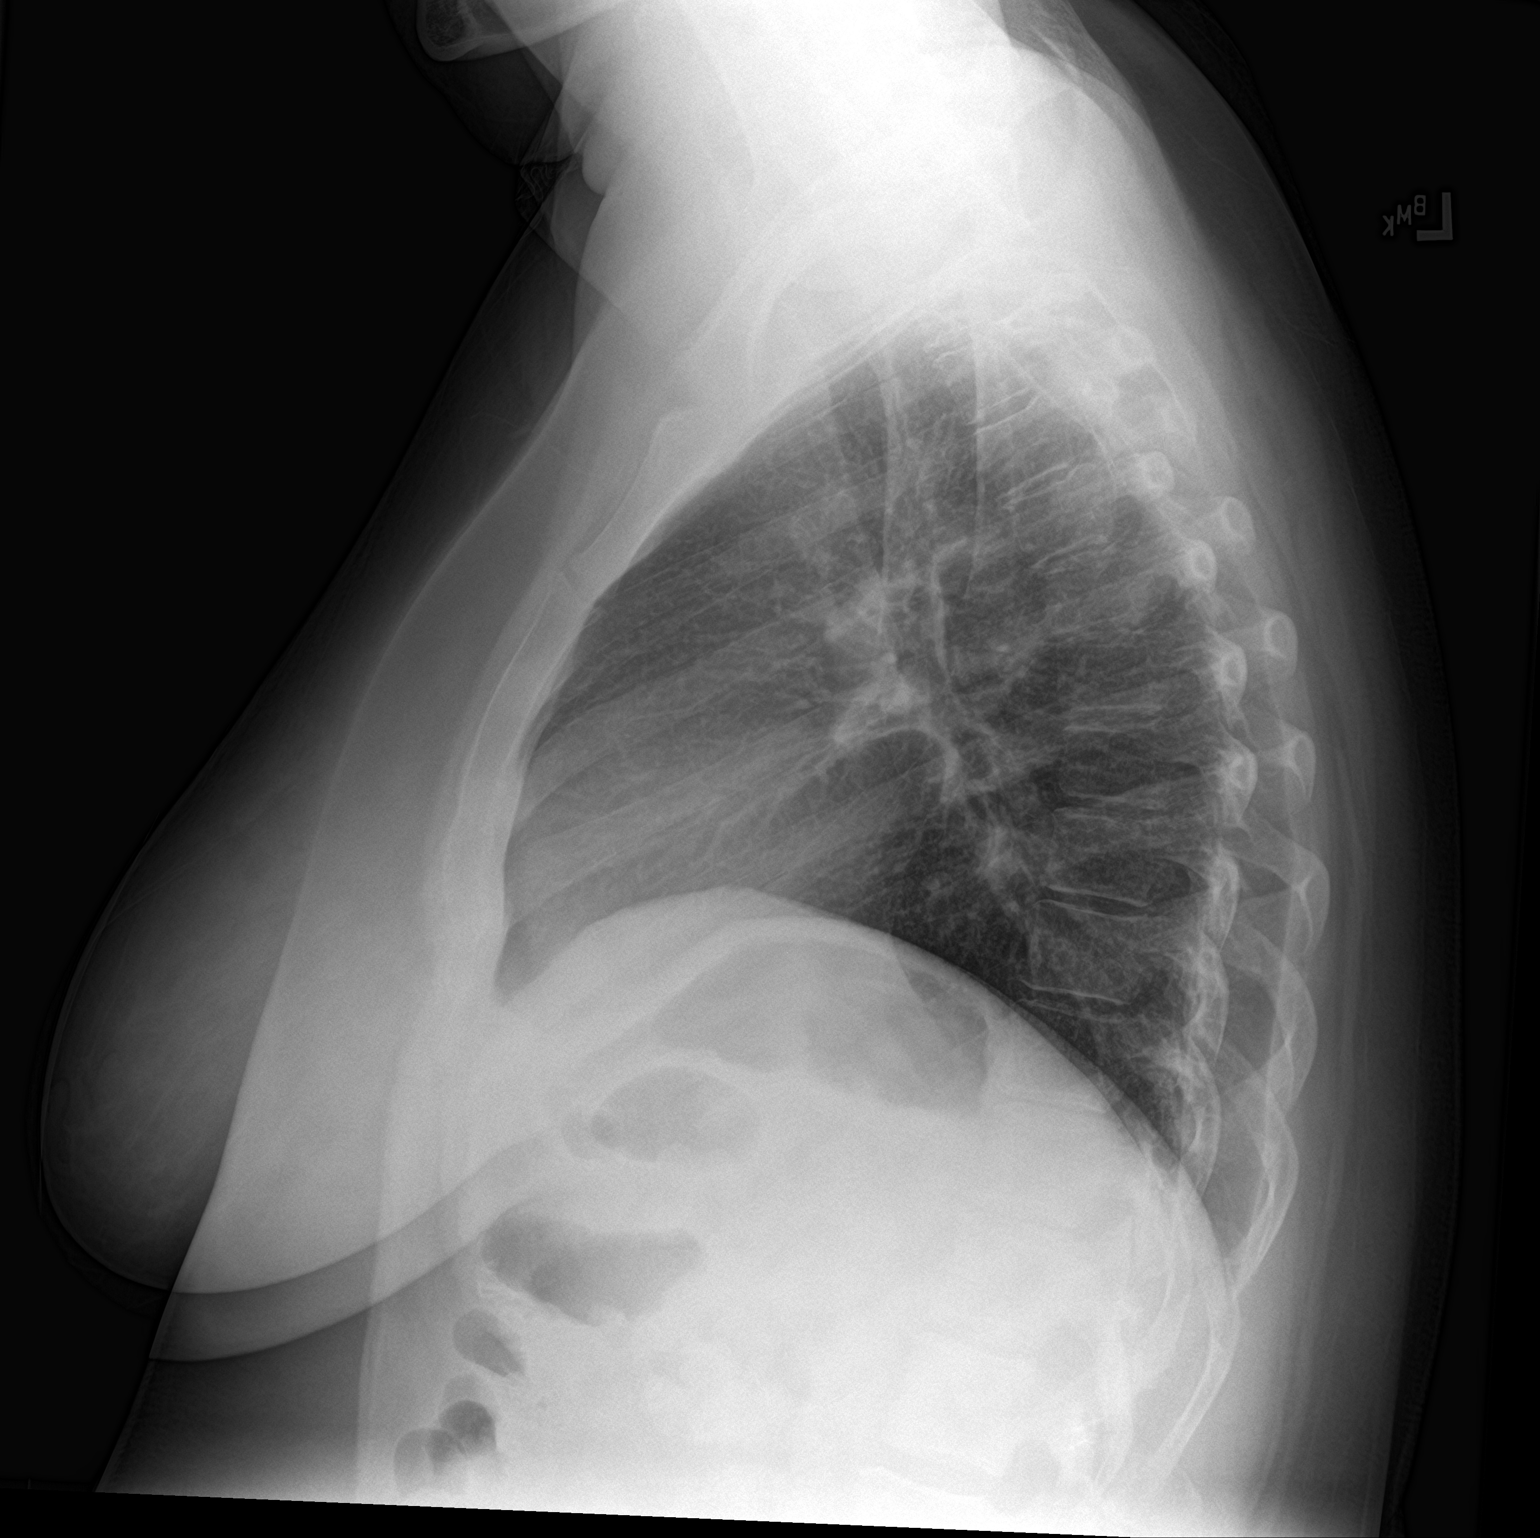

[2 of 2 positions shown; findings below may reference images not displayed]

FINDINGS: The heart size and mediastinal contours are within normal limits.
Both lungs are clear. The visualized skeletal structures are
unremarkable.
IMPRESSION: No active cardiopulmonary disease.
